# Patient Record
Sex: Female | Born: 1975 | Race: Black or African American | Hispanic: No | Smoking: Current some day smoker
Health system: Southern US, Community
[De-identification: ages and names within clinical notes are randomized; demographics above are authoritative.]

## PROBLEM LIST (undated history)

## (undated) DIAGNOSIS — I10 Essential (primary) hypertension: Secondary | ICD-10-CM

## (undated) HISTORY — PX: CORNEAL TRANSPLANT: SHX108

## (undated) HISTORY — DX: Essential (primary) hypertension: I10

---

## 2021-09-06 ENCOUNTER — Other Ambulatory Visit: Payer: Self-pay | Admitting: Family Medicine

## 2021-09-06 ENCOUNTER — Other Ambulatory Visit: Payer: Self-pay

## 2021-09-06 ENCOUNTER — Ambulatory Visit
Admission: RE | Admit: 2021-09-06 | Discharge: 2021-09-06 | Disposition: A | Discharge status: Home / Self Care | Payer: Managed Care, Other (non HMO) | Source: Ambulatory Visit | Attending: Family Medicine | Admitting: Family Medicine

## 2021-09-06 DIAGNOSIS — Z1231 Encounter for screening mammogram for malignant neoplasm of breast: Secondary | ICD-10-CM

## 2022-05-24 ENCOUNTER — Other Ambulatory Visit: Payer: Self-pay

## 2022-05-24 ENCOUNTER — Encounter (HOSPITAL_BASED_OUTPATIENT_CLINIC_OR_DEPARTMENT_OTHER): Payer: Self-pay | Admitting: Emergency Medicine

## 2022-05-24 ENCOUNTER — Emergency Department (HOSPITAL_BASED_OUTPATIENT_CLINIC_OR_DEPARTMENT_OTHER)
Admission: EM | Admit: 2022-05-24 | Discharge: 2022-05-24 | Disposition: A | Discharge status: Home / Self Care | Payer: Managed Care, Other (non HMO) | Attending: Emergency Medicine | Admitting: Emergency Medicine

## 2022-05-24 DIAGNOSIS — F17210 Nicotine dependence, cigarettes, uncomplicated: Secondary | ICD-10-CM | POA: Insufficient documentation

## 2022-05-24 DIAGNOSIS — L232 Allergic contact dermatitis due to cosmetics: Secondary | ICD-10-CM | POA: Insufficient documentation

## 2022-05-24 DIAGNOSIS — I10 Essential (primary) hypertension: Secondary | ICD-10-CM | POA: Insufficient documentation

## 2022-05-24 MED ORDER — FAMOTIDINE 20 MG PO TABS: 20.0000 mg | ORAL_TABLET | Freq: Two times a day (BID) | ORAL | 0 refills | Status: AC

## 2022-05-24 MED ORDER — PREDNISONE 10 MG PO TABS: ORAL_TABLET | ORAL | 0 refills | Status: AC

## 2022-05-24 MED ORDER — DIPHENHYDRAMINE HCL 25 MG PO TABS: 25.0000 mg | ORAL_TABLET | Freq: Four times a day (QID) | ORAL | 0 refills | Status: AC

## 2022-05-24 MED ORDER — PREDNISONE 50 MG PO TABS
60.0000 mg | ORAL_TABLET | Freq: Once | ORAL | Status: AC
  Administered 2022-05-24: 60 mg via ORAL
  Filled 2022-05-24: qty 1

## 2022-05-24 NOTE — ED Triage Notes (Signed)
Pt thinks having allergic reaction from a hair dye used on wednesday. Swelling itching and drainage.

## 2022-05-24 NOTE — Discharge Instructions (Signed)
You were evaluated in the Emergency Department and after careful evaluation, we did not find any emergent condition requiring admission or further testing in the hospital.  Your exam/testing today is overall reassuring.  Symptoms seem to be due to an allergic reaction.  Take the prednisone taper as we discussed.  Use the Pepcid twice daily and the Benadryl 4 times daily as needed for itch or discomfort.  Please return to the Emergency Department if you experience any worsening of your condition.   Thank you for allowing [place] to be a part of your care.

## 2022-05-24 NOTE — ED Notes (Addendum)
Pt states has been taking benadryl every hours. States helps with itching but not swelling.

## 2022-05-24 NOTE — ED Provider Notes (Signed)
[facility] Emergency Department Provider Note MRN:  [id number]  Arrival date & time: 05/24/22     Chief Complaint   Allergic Reaction   History of Present Illness   Bianca Jensen is a 46 y.o. year-old female with a history of hypertension presenting to the ED with chief complaint of allergic reaction.  Patient had her hair dyed a few days ago.  The next day had some itching and irritation and this has progressed to swelling and weeping to the skin of her scalp, swelling spreading to the skin of the forehead, also has some itching to the hands.  No nausea vomiting, no trouble breathing or throat closure sensation, no diarrhea.  Review of Systems  A thorough review of systems was obtained and all systems are negative except as noted in the HPI and PMH.   Patient's Health History    Past Medical History:  Diagnosis Date   Hypertension     Past Surgical History:  Procedure Laterality Date   CORNEAL TRANSPLANT Left     Family History  Problem Relation Age of Onset   Breast cancer Mother     Social History   Socioeconomic History   Marital status: Unknown    Spouse name: Not on file   Number of children: Not on file   Years of education: Not on file   Highest education level: Not on file  Occupational History   Not on file  Tobacco Use   Smoking status: Some Days    Types: Cigarettes   Smokeless tobacco: Never  Substance and Sexual Activity   Alcohol use: Yes   Drug use: Never   Sexual activity: Not on file  Other Topics Concern   Not on file  Social History Narrative   Not on file   Social Determinants of Health   Financial Resource Strain: Not on file  Food Insecurity: Not on file  Transportation Needs: Not on file  Physical Activity: Not on file  Stress: Not on file  Social Connections: Not on file  Intimate Partner Violence: Not on file     Physical Exam   Vitals:   05/24/22 0400  BP: (!) 146/98  Pulse: 92  Resp: 18   Temp: 98.4 F (36.9 C)  SpO2: 98%    CONSTITUTIONAL: Well-appearing, NAD NEURO/PSYCH:  Alert and oriented x 3, no focal deficits EYES:  eyes equal and reactive ENT/NECK:  no LAD, no JVD CARDIO: Regular rate, well-perfused, normal S1 and S2 PULM:  CTAB no wheezing or rhonchi GI/GU:  non-distended, non-tender MSK/SPINE:  No gross deformities, no edema SKIN: Indurated weeping rash of the scalp and upper forehead   *Additional and/or pertinent findings included in MDM below  Diagnostic and Interventional Summary    EKG Interpretation  Date/Time:    Ventricular Rate:    PR Interval:    QRS Duration:   QT Interval:    QTC Calculation:   R Axis:     Text Interpretation:         Labs Reviewed - No data to display  No orders to display    Medications  predniSONE (DELTASONE) tablet 60 mg (60 mg Oral Given 05/24/22 0423)     Procedures  /  Critical Care Procedures  ED Course and Medical Decision Making  Initial Impression and Ddx Exam is consistent with contact dermatitis due to cosmetic allergy.  Providing steroid taper, appropriate for discharge as there is no sign of anaphylaxis or any other emergent process.  Past medical/surgical history that increases complexity of ED encounter: None  Interpretation of Diagnostics Laboratory and/or imaging options to aid in the diagnosis/care of the patient were considered.  After careful history and physical examination, it was determined that there was no indication for diagnostics at this time.  Patient Reassessment and Ultimate Disposition/Management     Discharge  Patient management required discussion with the following services or consulting groups:  None  Complexity of Problems Addressed Acute complicated illness or Injury  Additional Data Reviewed and Analyzed Further history obtained from: None  Additional Factors Impacting ED Encounter Risk Prescriptions  Elmer Sow. Pilar Plate, MD [facility]  Medicine [facility] [email or web address]  Final Clinical Impressions(s) / ED Diagnoses     ICD-10-CM   1. Allergic contact dermatitis due to cosmetics  L23.2       ED Discharge Orders          Ordered    predniSONE (DELTASONE) 10 MG tablet        05/24/22 0431    diphenhydrAMINE (BENADRYL) 25 MG tablet  Every 6 hours        05/24/22 0431    famotidine (PEPCID) 20 MG tablet  2 times daily        05/24/22 0431             Discharge Instructions Discussed with and Provided to Patient:    Discharge Instructions      You were evaluated in the Emergency Department and after careful evaluation, we did not find any emergent condition requiring admission or further testing in the hospital.  Your exam/testing today is overall reassuring.  Symptoms seem to be due to an allergic reaction.  Take the prednisone taper as we discussed.  Use the Pepcid twice daily and the Benadryl 4 times daily as needed for itch or discomfort.  Please return to the Emergency Department if you experience any worsening of your condition.   Thank you for allowing [place] to be a part of your care.      Sabas Sous, MD 05/24/22 [id number]

## 2023-11-27 IMAGING — MG MM DIGITAL SCREENING BILAT W/ TOMO AND CAD
6 of 10 series · 6 of 30 positions shown · non-contrast
Comparison: Previous exam(s).

CLINICAL DATA: Screening.

EXAM:
DIGITAL SCREENING BILATERAL MAMMOGRAM WITH TOMOSYNTHESIS AND CAD
TECHNIQUE: Bilateral screening digital craniocaudal and mediolateral oblique
mammograms were obtained. Bilateral screening digital breast
tomosynthesis was performed. The images were evaluated with
computer-aided detection.

[L CC synth-2D]
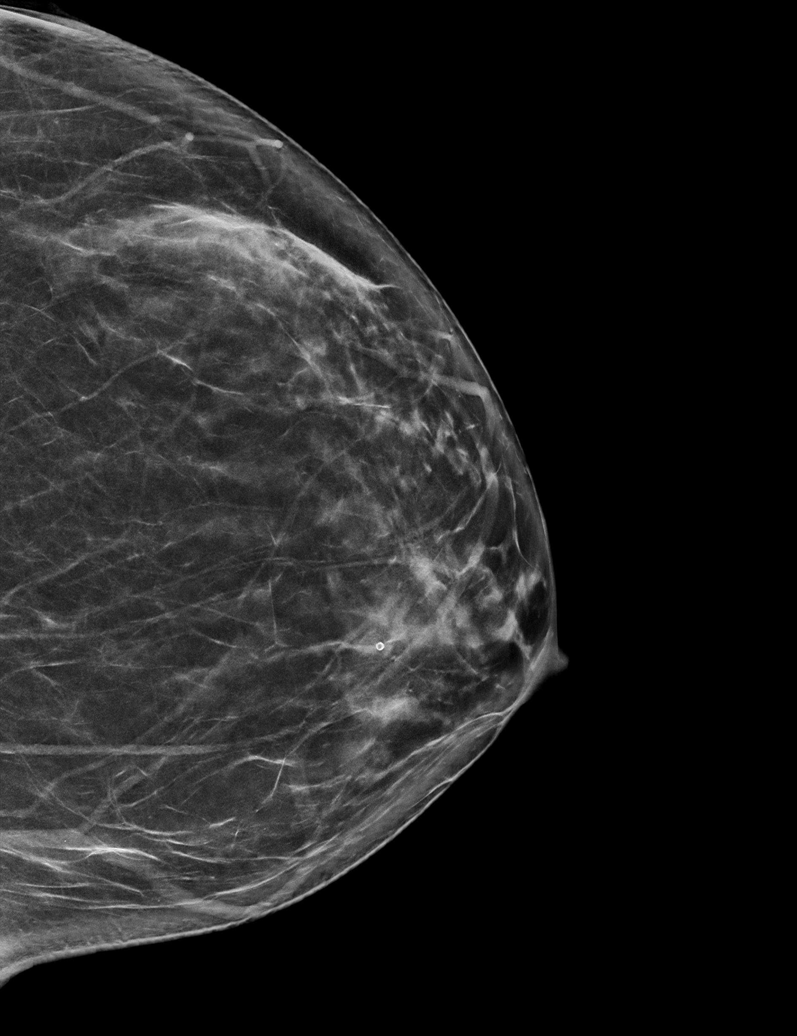

[R MLO synth-2D (1 of 2)]
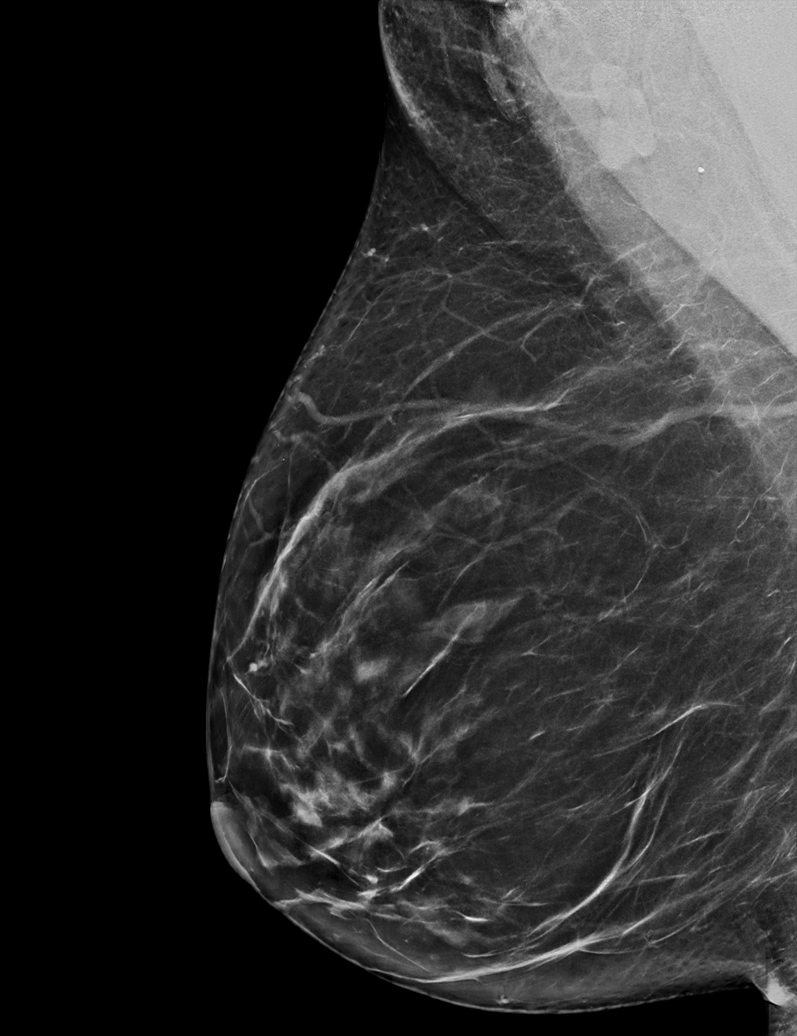

[R CC synth-2D]
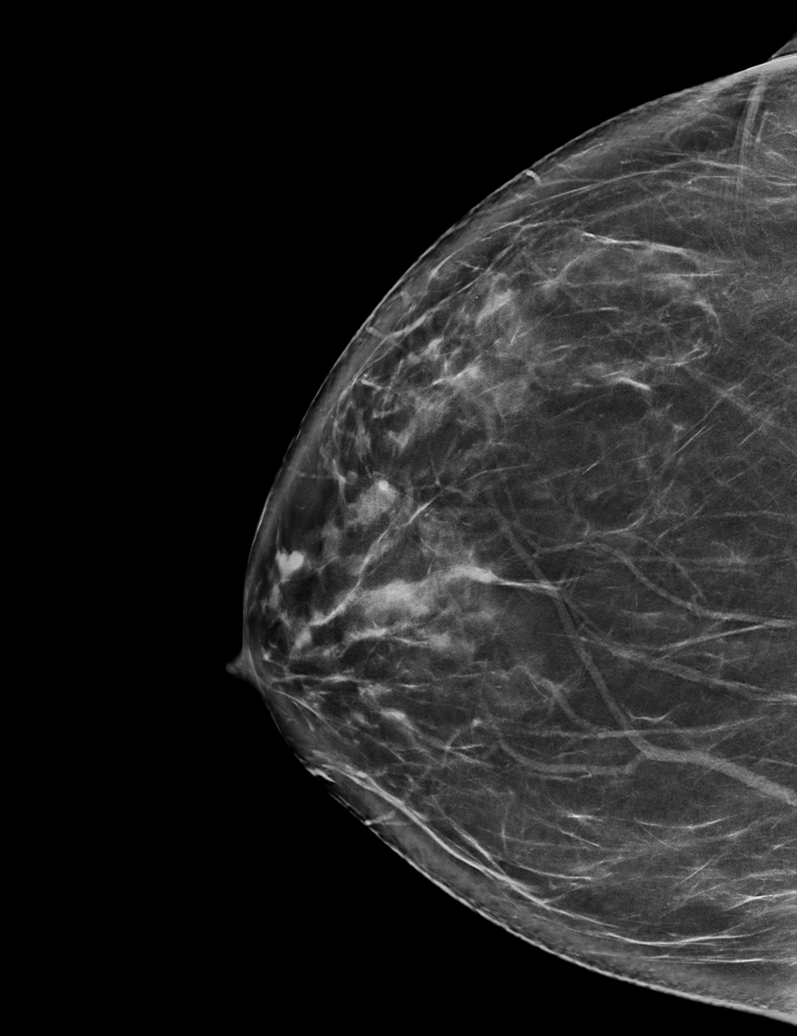

[R MLO synth-2D (2 of 2)]
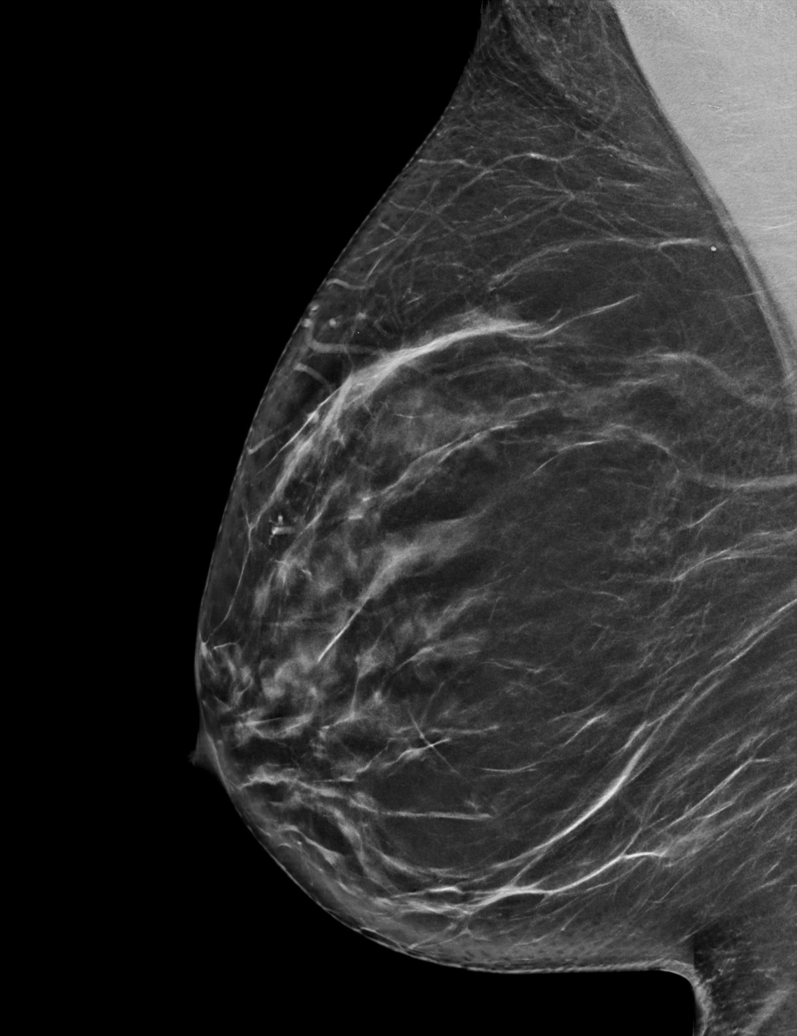

[L MLO synth-2D]
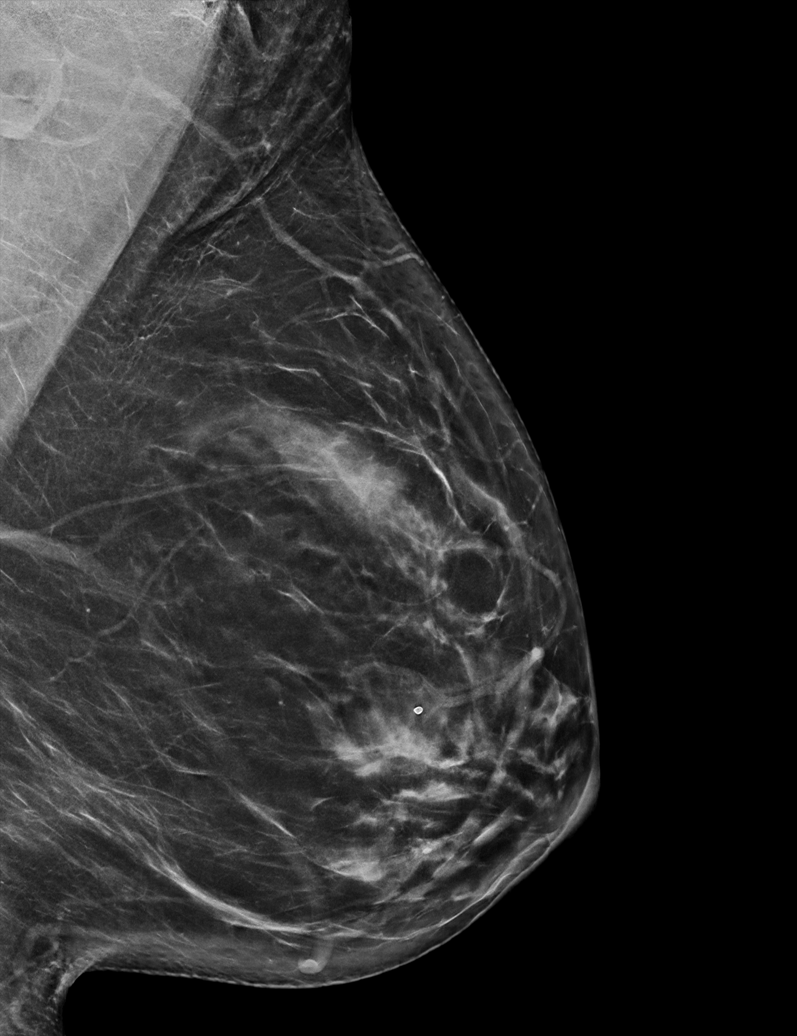

[R CC tomo · tomo slice 37/73.0]
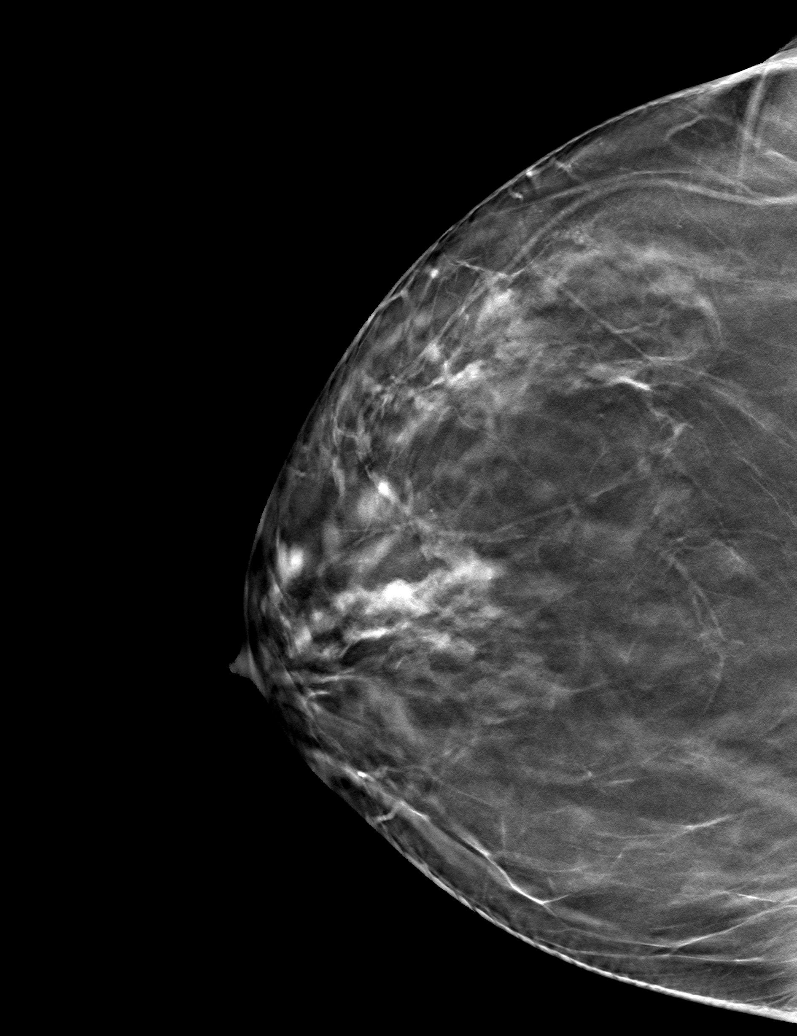

[6 of 30 positions shown; findings below may reference images not displayed]

ACR Breast Density Category b: There are scattered areas of
fibroglandular density.
FINDINGS: There are no findings suspicious for malignancy.
IMPRESSION: No mammographic evidence of malignancy. A result letter of this
screening mammogram will be mailed directly to the patient.

RECOMMENDATION:
Screening mammogram in one year. (Code:51-O-LD2)

BI-RADS CATEGORY  1: Negative.
